# Patient Record
Sex: Female | Born: 1990 | Hispanic: Yes | Smoking: Never smoker
Health system: Southern US, Community
[De-identification: ages and names within clinical notes are randomized; demographics above are authoritative.]

---

## 2020-02-28 NOTE — L&D Delivery Note (Signed)
Delivery Note At 7:04 AM a viable female was delivered via Vaginal, Spontaneous (Presentation:   Occiput Anterior).  APGAR: 9, 9; weight  pending,.After 1 minute, the cord was clamped and cut. 40 units of pitocin diluted in 1000cc LR was infused rapidly IV.  The placenta separated spontaneously and delivered via CCT and maternal pushing effort.  It was inspected and appears to be intact with a 3 VC, succenturiate lobe noted, intact. She kept having a boggy uterus w/gushes of blood; bladder emptied, IM Methergine, cytotec pr, IM hemabate, IV TXA all give.  Dr. Donavan Foil assessed pt; after about 30 minutes, uterus became firm and bleeding normal.   Anesthesia: None Episiotomy: None Lacerations: None Suture Repair:  Est. Blood Loss (mL):  700  Mom to postpartum.  Baby to Couplet care / Skin to Skin.  Crystal Curry 09/17/2020, 8:05 AM

## 2020-05-27 LAB — OB RESULTS CONSOLE GC/CHLAMYDIA
Chlamydia: NEGATIVE
Gonorrhea: NEGATIVE

## 2020-05-27 LAB — OB RESULTS CONSOLE RPR: RPR: NONREACTIVE

## 2020-05-27 LAB — OB RESULTS CONSOLE HIV ANTIBODY (ROUTINE TESTING): HIV: NONREACTIVE

## 2020-05-27 LAB — OB RESULTS CONSOLE RUBELLA ANTIBODY, IGM: Rubella: IMMUNE

## 2020-05-27 LAB — OB RESULTS CONSOLE VARICELLA ZOSTER ANTIBODY, IGG: Varicella: IMMUNE

## 2020-05-27 LAB — OB RESULTS CONSOLE HEPATITIS B SURFACE ANTIGEN: Hepatitis B Surface Ag: NEGATIVE

## 2020-05-27 LAB — HEPATITIS C ANTIBODY: HCV Ab: NEGATIVE

## 2020-06-03 ENCOUNTER — Other Ambulatory Visit: Payer: Self-pay | Admitting: Obstetrics and Gynecology

## 2020-06-03 ENCOUNTER — Ambulatory Visit
Admission: RE | Admit: 2020-06-03 | Discharge: 2020-06-03 | Disposition: A | Discharge status: Home / Self Care | Payer: No Typology Code available for payment source | Source: Ambulatory Visit | Attending: Obstetrics and Gynecology | Admitting: Obstetrics and Gynecology

## 2020-06-03 DIAGNOSIS — R7611 Nonspecific reaction to tuberculin skin test without active tuberculosis: Secondary | ICD-10-CM

## 2020-06-25 ENCOUNTER — Other Ambulatory Visit: Payer: No Typology Code available for payment source

## 2020-06-25 ENCOUNTER — Ambulatory Visit: Payer: No Typology Code available for payment source

## 2020-06-28 ENCOUNTER — Other Ambulatory Visit: Payer: Self-pay

## 2020-06-28 ENCOUNTER — Other Ambulatory Visit: Payer: Self-pay | Admitting: *Deleted

## 2020-06-28 ENCOUNTER — Other Ambulatory Visit: Payer: Self-pay | Admitting: Family

## 2020-06-28 ENCOUNTER — Ambulatory Visit (HOSPITAL_BASED_OUTPATIENT_CLINIC_OR_DEPARTMENT_OTHER): Payer: Self-pay

## 2020-06-28 ENCOUNTER — Ambulatory Visit: Payer: No Typology Code available for payment source | Attending: Family | Admitting: *Deleted

## 2020-06-28 VITALS — BP 112/65 | HR 104 | Ht 65.0 in

## 2020-06-28 DIAGNOSIS — O99212 Obesity complicating pregnancy, second trimester: Secondary | ICD-10-CM

## 2020-06-28 DIAGNOSIS — Z3A25 25 weeks gestation of pregnancy: Secondary | ICD-10-CM | POA: Insufficient documentation

## 2020-06-28 DIAGNOSIS — Z3689 Encounter for other specified antenatal screening: Secondary | ICD-10-CM

## 2020-06-28 DIAGNOSIS — Z363 Encounter for antenatal screening for malformations: Secondary | ICD-10-CM

## 2020-06-28 DIAGNOSIS — Z6830 Body mass index (BMI) 30.0-30.9, adult: Secondary | ICD-10-CM

## 2020-08-09 ENCOUNTER — Other Ambulatory Visit: Payer: Self-pay

## 2020-08-09 ENCOUNTER — Encounter: Payer: Self-pay | Admitting: *Deleted

## 2020-08-09 ENCOUNTER — Ambulatory Visit: Payer: No Typology Code available for payment source | Admitting: *Deleted

## 2020-08-09 ENCOUNTER — Other Ambulatory Visit: Payer: Self-pay | Admitting: *Deleted

## 2020-08-09 ENCOUNTER — Ambulatory Visit: Payer: No Typology Code available for payment source | Attending: Obstetrics and Gynecology

## 2020-08-09 VITALS — BP 114/68 | HR 115

## 2020-08-09 DIAGNOSIS — Z362 Encounter for other antenatal screening follow-up: Secondary | ICD-10-CM

## 2020-08-09 DIAGNOSIS — Z3689 Encounter for other specified antenatal screening: Secondary | ICD-10-CM

## 2020-08-09 DIAGNOSIS — Z6830 Body mass index (BMI) 30.0-30.9, adult: Secondary | ICD-10-CM

## 2020-08-09 DIAGNOSIS — E669 Obesity, unspecified: Secondary | ICD-10-CM

## 2020-08-09 DIAGNOSIS — Z3A31 31 weeks gestation of pregnancy: Secondary | ICD-10-CM

## 2020-08-09 DIAGNOSIS — O43103 Malformation of placenta, unspecified, third trimester: Secondary | ICD-10-CM

## 2020-08-09 DIAGNOSIS — O99213 Obesity complicating pregnancy, third trimester: Secondary | ICD-10-CM

## 2020-09-06 ENCOUNTER — Other Ambulatory Visit: Payer: Self-pay

## 2020-09-06 ENCOUNTER — Encounter: Payer: Self-pay | Admitting: *Deleted

## 2020-09-06 ENCOUNTER — Other Ambulatory Visit: Payer: Self-pay | Admitting: *Deleted

## 2020-09-06 ENCOUNTER — Ambulatory Visit: Payer: No Typology Code available for payment source | Admitting: *Deleted

## 2020-09-06 ENCOUNTER — Ambulatory Visit: Payer: Medicaid Other | Attending: Obstetrics and Gynecology

## 2020-09-06 VITALS — BP 103/67 | HR 99

## 2020-09-06 DIAGNOSIS — O365990 Maternal care for other known or suspected poor fetal growth, unspecified trimester, not applicable or unspecified: Secondary | ICD-10-CM

## 2020-09-06 DIAGNOSIS — Z6830 Body mass index (BMI) 30.0-30.9, adult: Secondary | ICD-10-CM

## 2020-09-06 DIAGNOSIS — Z362 Encounter for other antenatal screening follow-up: Secondary | ICD-10-CM

## 2020-09-06 DIAGNOSIS — E669 Obesity, unspecified: Secondary | ICD-10-CM

## 2020-09-06 DIAGNOSIS — O99213 Obesity complicating pregnancy, third trimester: Secondary | ICD-10-CM | POA: Diagnosis not present

## 2020-09-06 DIAGNOSIS — Z3A35 35 weeks gestation of pregnancy: Secondary | ICD-10-CM

## 2020-09-06 DIAGNOSIS — O43103 Malformation of placenta, unspecified, third trimester: Secondary | ICD-10-CM

## 2020-09-06 DIAGNOSIS — Z3689 Encounter for other specified antenatal screening: Secondary | ICD-10-CM | POA: Diagnosis not present

## 2020-09-17 ENCOUNTER — Encounter (HOSPITAL_COMMUNITY): Payer: Self-pay | Admitting: Obstetrics and Gynecology

## 2020-09-17 ENCOUNTER — Other Ambulatory Visit: Payer: Self-pay

## 2020-09-17 ENCOUNTER — Inpatient Hospital Stay (HOSPITAL_COMMUNITY)
Admission: AD | Admit: 2020-09-17 | Discharge: 2020-09-19 | DRG: 807 | Disposition: A | Discharge status: Home / Self Care | Payer: Medicaid Other | Attending: Obstetrics and Gynecology | Admitting: Obstetrics and Gynecology

## 2020-09-17 DIAGNOSIS — O99820 Streptococcus B carrier state complicating pregnancy: Secondary | ICD-10-CM | POA: Diagnosis not present

## 2020-09-17 DIAGNOSIS — Z20822 Contact with and (suspected) exposure to covid-19: Secondary | ICD-10-CM | POA: Diagnosis present

## 2020-09-17 DIAGNOSIS — Z3A36 36 weeks gestation of pregnancy: Secondary | ICD-10-CM

## 2020-09-17 DIAGNOSIS — O99824 Streptococcus B carrier state complicating childbirth: Secondary | ICD-10-CM | POA: Diagnosis present

## 2020-09-17 LAB — CBC
HCT: 37.8 % (ref 36.0–46.0)
HCT: 38.5 % (ref 36.0–46.0)
Hemoglobin: 12.5 g/dL (ref 12.0–15.0)
Hemoglobin: 12.5 g/dL (ref 12.0–15.0)
MCH: 27.4 pg (ref 26.0–34.0)
MCH: 27.2 pg (ref 26.0–34.0)
MCHC: 33.1 g/dL (ref 30.0–36.0)
MCHC: 32.5 g/dL (ref 30.0–36.0)
MCV: 82.7 fL (ref 80.0–100.0)
MCV: 83.7 fL (ref 80.0–100.0)
Platelets: 311 10*3/uL (ref 150–400)
Platelets: 327 10*3/uL (ref 150–400)
RBC: 4.57 MIL/uL (ref 3.87–5.11)
RBC: 4.6 MIL/uL (ref 3.87–5.11)
RDW: 13.3 % (ref 11.5–15.5)
RDW: 13.5 % (ref 11.5–15.5)
WBC: 13.3 10*3/uL — ABNORMAL HIGH (ref 4.0–10.5)
WBC: 20.7 10*3/uL — ABNORMAL HIGH (ref 4.0–10.5)
nRBC: 0 % (ref 0.0–0.2)
nRBC: 0 % (ref 0.0–0.2)

## 2020-09-17 LAB — RESP PANEL BY RT-PCR (FLU A&B, COVID) ARPGX2
Influenza A by PCR: NEGATIVE
Influenza B by PCR: NEGATIVE
SARS Coronavirus 2 by RT PCR: NEGATIVE

## 2020-09-17 LAB — RPR: RPR Ser Ql: NONREACTIVE

## 2020-09-17 LAB — TYPE AND SCREEN
ABO/RH(D): O POS
Antibody Screen: NEGATIVE

## 2020-09-17 LAB — POCT FERN TEST

## 2020-09-17 MED ORDER — TRANEXAMIC ACID-NACL 1000-0.7 MG/100ML-% IV SOLN: 1000.0000 mg | INTRAVENOUS | Status: AC

## 2020-09-17 MED ORDER — OXYCODONE-ACETAMINOPHEN 5-325 MG PO TABS
1.0000 | ORAL_TABLET | ORAL | Status: DC | PRN
  Administered 2020-09-17: 1 via ORAL
  Filled 2020-09-17: qty 1

## 2020-09-17 MED ORDER — OXYCODONE-ACETAMINOPHEN 5-325 MG PO TABS
2.0000 | ORAL_TABLET | ORAL | Status: DC | PRN
Start: 2020-09-17 — End: 2020-09-17

## 2020-09-17 MED ORDER — IBUPROFEN 600 MG PO TABS
600.0000 mg | ORAL_TABLET | Freq: Four times a day (QID) | ORAL | Status: DC
  Administered 2020-09-17 – 2020-09-19 (×9): 600 mg via ORAL
  Filled 2020-09-17 (×9): qty 1

## 2020-09-17 MED ORDER — METHYLERGONOVINE MALEATE 0.2 MG/ML IJ SOLN: 0.2000 mg | Freq: Once | INTRAMUSCULAR | Status: AC

## 2020-09-17 MED ORDER — MEASLES, MUMPS & RUBELLA VAC IJ SOLR: 0.5000 mL | Freq: Once | INTRAMUSCULAR | Status: DC

## 2020-09-17 MED ORDER — OXYTOCIN-SODIUM CHLORIDE 30-0.9 UT/500ML-% IV SOLN
2.5000 [IU]/h | INTRAVENOUS | Status: DC
  Administered 2020-09-17: 2.5 [IU]/h via INTRAVENOUS

## 2020-09-17 MED ORDER — CARBOPROST TROMETHAMINE 250 MCG/ML IM SOLN
250.0000 ug | Freq: Once | INTRAMUSCULAR | Status: AC
  Administered 2020-09-17: 250 ug via INTRAMUSCULAR

## 2020-09-17 MED ORDER — FLEET ENEMA 7-19 GM/118ML RE ENEM: 1.0000 | ENEMA | RECTAL | Status: DC | PRN

## 2020-09-17 MED ORDER — ACETAMINOPHEN 325 MG PO TABS: 650.0000 mg | ORAL_TABLET | ORAL | Status: DC | PRN

## 2020-09-17 MED ORDER — CARBOPROST TROMETHAMINE 250 MCG/ML IM SOLN
INTRAMUSCULAR | Status: AC
  Filled 2020-09-17: qty 1

## 2020-09-17 MED ORDER — METHYLERGONOVINE MALEATE 0.2 MG/ML IJ SOLN
INTRAMUSCULAR | Status: AC
  Administered 2020-09-17: 0.2 mg via INTRAMUSCULAR
  Filled 2020-09-17: qty 1

## 2020-09-17 MED ORDER — SIMETHICONE 80 MG PO CHEW: 80.0000 mg | CHEWABLE_TABLET | ORAL | Status: DC | PRN

## 2020-09-17 MED ORDER — ONDANSETRON HCL 4 MG PO TABS: 4.0000 mg | ORAL_TABLET | ORAL | Status: DC | PRN

## 2020-09-17 MED ORDER — DIBUCAINE (PERIANAL) 1 % EX OINT: 1.0000 "application " | TOPICAL_OINTMENT | CUTANEOUS | Status: DC | PRN

## 2020-09-17 MED ORDER — FENTANYL CITRATE (PF) 100 MCG/2ML IJ SOLN: 50.0000 ug | INTRAMUSCULAR | Status: DC | PRN

## 2020-09-17 MED ORDER — ONDANSETRON HCL 4 MG/2ML IJ SOLN: 4.0000 mg | INTRAMUSCULAR | Status: DC | PRN

## 2020-09-17 MED ORDER — MAGNESIUM HYDROXIDE 400 MG/5ML PO SUSP: 30.0000 mL | ORAL | Status: DC | PRN

## 2020-09-17 MED ORDER — PRENATAL MULTIVITAMIN CH
1.0000 | ORAL_TABLET | Freq: Every day | ORAL | Status: DC
Start: 2020-09-17 — End: 2020-09-19
  Administered 2020-09-17 – 2020-09-19 (×3): 1 via ORAL
  Filled 2020-09-17 (×3): qty 1

## 2020-09-17 MED ORDER — HYDROXYZINE HCL 25 MG PO TABS
50.0000 mg | ORAL_TABLET | Freq: Four times a day (QID) | ORAL | Status: DC | PRN
Start: 2020-09-17 — End: 2020-09-17
  Filled 2020-09-17: qty 1

## 2020-09-17 MED ORDER — MISOPROSTOL 200 MCG PO TABS: 800.0000 ug | ORAL_TABLET | Freq: Once | ORAL | Status: DC

## 2020-09-17 MED ORDER — COCONUT OIL OIL: 1.0000 "application " | TOPICAL_OIL | Status: DC | PRN

## 2020-09-17 MED ORDER — ONDANSETRON HCL 4 MG/2ML IJ SOLN: 4.0000 mg | Freq: Four times a day (QID) | INTRAMUSCULAR | Status: DC | PRN

## 2020-09-17 MED ORDER — MISOPROSTOL 200 MCG PO TABS
ORAL_TABLET | ORAL | Status: AC
  Administered 2020-09-17: 800 ug via RECTAL
  Filled 2020-09-17: qty 4

## 2020-09-17 MED ORDER — LACTATED RINGERS IV SOLN: INTRAVENOUS | Status: DC

## 2020-09-17 MED ORDER — LIDOCAINE HCL (PF) 1 % IJ SOLN
30.0000 mL | INTRAMUSCULAR | Status: DC | PRN
  Filled 2020-09-17: qty 30

## 2020-09-17 MED ORDER — DIPHENHYDRAMINE HCL 25 MG PO CAPS: 25.0000 mg | ORAL_CAPSULE | Freq: Four times a day (QID) | ORAL | Status: DC | PRN

## 2020-09-17 MED ORDER — PENICILLIN G POT IN DEXTROSE 60000 UNIT/ML IV SOLN: 3.0000 10*6.[IU] | INTRAVENOUS | Status: DC

## 2020-09-17 MED ORDER — MISOPROSTOL 200 MCG PO TABS: 800.0000 ug | ORAL_TABLET | Freq: Once | ORAL | Status: AC

## 2020-09-17 MED ORDER — SOD CITRATE-CITRIC ACID 500-334 MG/5ML PO SOLN: 30.0000 mL | ORAL | Status: DC | PRN

## 2020-09-17 MED ORDER — LACTATED RINGERS IV SOLN
500.0000 mL | INTRAVENOUS | Status: DC | PRN
  Administered 2020-09-17: 1000 mL via INTRAVENOUS

## 2020-09-17 MED ORDER — WITCH HAZEL-GLYCERIN EX PADS: 1.0000 "application " | MEDICATED_PAD | CUTANEOUS | Status: DC | PRN

## 2020-09-17 MED ORDER — PENICILLIN G POTASSIUM 5000000 UNITS IJ SOLR
5.0000 10*6.[IU] | Freq: Once | INTRAMUSCULAR | Status: AC
  Administered 2020-09-17: 5 10*6.[IU] via INTRAVENOUS
  Filled 2020-09-17: qty 5

## 2020-09-17 MED ORDER — OXYTOCIN BOLUS FROM INFUSION
333.0000 mL | Freq: Once | INTRAVENOUS | Status: AC
Start: 2020-09-17 — End: 2020-09-17
  Administered 2020-09-17: 333 mL via INTRAVENOUS

## 2020-09-17 MED ORDER — BENZOCAINE-MENTHOL 20-0.5 % EX AERO
1.0000 "application " | INHALATION_SPRAY | CUTANEOUS | Status: DC | PRN
  Administered 2020-09-17: 1 via TOPICAL
  Filled 2020-09-17: qty 56

## 2020-09-17 MED ORDER — TRANEXAMIC ACID-NACL 1000-0.7 MG/100ML-% IV SOLN
INTRAVENOUS | Status: AC
  Administered 2020-09-17: 1000 mg via INTRAVENOUS
  Filled 2020-09-17: qty 100

## 2020-09-17 MED ORDER — DIPHENOXYLATE-ATROPINE 2.5-0.025 MG PO TABS
2.0000 | ORAL_TABLET | Freq: Once | ORAL | Status: AC
  Administered 2020-09-17: 2 via ORAL
  Filled 2020-09-17: qty 2

## 2020-09-17 MED ORDER — TETANUS-DIPHTH-ACELL PERTUSSIS 5-2.5-18.5 LF-MCG/0.5 IM SUSY: 0.5000 mL | PREFILLED_SYRINGE | Freq: Once | INTRAMUSCULAR | Status: DC

## 2020-09-17 NOTE — H&P (Signed)
Crystal Curry is a 30 y.o. female G3P2002 with IUP at [redacted]w[redacted]d by LMP, 19 week [place] presenting for ROM/labor.  She reports positive fetal movement. She denies leakage of fluid or vaginal bleeding.  Prenatal History/Complications: PNC at HD Pregnancy complications: none - Past Medical History: History reviewed. No pertinent past medical history.  Past Surgical History: History reviewed. No pertinent surgical history.  Obstetrical History: OB History     Gravida  3   Para  2   Term  2   Preterm      AB      Living  2      SAB      IAB      Ectopic      Multiple      Live Births               Social History: Social History   Socioeconomic History   Marital status: Unknown    Spouse name: Not on file   Number of children: Not on file   Years of education: Not on file   Highest education level: Not on file  Occupational History   Not on file  Tobacco Use   Smoking status: Never   Smokeless tobacco: Never  Vaping Use   Vaping Use: Never used  Substance and Sexual Activity   Alcohol use: Never   Drug use: Never   Sexual activity: Not on file  Other Topics Concern   Not on file  Social History Narrative   Not on file   Social Determinants of Health   Financial Resource Strain: Not on file  Food Insecurity: Not on file  Transportation Needs: Not on file  Physical Activity: Not on file  Stress: Not on file  Social Connections: Not on file    Family History: Family History  Problem Relation Age of Onset   Diabetes Mother     Allergies: No Known Allergies  Medications Prior to Admission  Medication Sig Dispense Refill Last Dose   Prenatal Vit-Fe Fumarate-FA (PRENATAL MULTIVITAMIN) TABS tablet Take 1 tablet by mouth daily at 12 noon.       Review of Systems   Constitutional: Negative for fever and chills Eyes: Negative for visual disturbances Respiratory: Negative for shortness of breath, dyspnea Cardiovascular: Negative  for chest pain or palpitations  Gastrointestinal: Negative for vomiting, diarrhea and constipation.  POSITIVE for abdominal pain (contractions) Genitourinary: Negative for dysuria and urgency Musculoskeletal: Negative for back pain, joint pain, myalgias  Neurological: Negative for dizziness and headaches  Blood pressure 124/76, pulse 90, temperature 98.6 F (37 C), temperature source Oral, resp. rate 18, height 5\' 4"  (1.626 m), weight 99.8 kg, last menstrual period 01/05/2020, SpO2 99 %. General appearance: alert, cooperative, and mild distress Lungs: normal respiratory effort Heart: regular rate and rhythm Abdomen: soft, non-tender; bowel sounds normal Extremities: Homans sign is negative, no sign of DVT DTR's 2+ Presentation: cephalic Fetal monitoring  Baseline: 150 bpm, Variability: Good {> 6 bpm), Accelerations: Reactive, and Decelerations: Absent Uterine activity  2 Dilation: 8 Effacement (%): 90 Station: 0 Exam by:: [email or web address] RNC   Prenatal labs: ABO, Rh: --/--/O POS (07/22 05-08-1967) Antibody: NEG (07/22 [birth year]-08-1968) Rubella:  imm RPR:   neg HBsAg:   neg HIV:   neg GBS:   pos 1 hr Glucola 86 Genetic screening  declined Anatomy 9604 normal,  Prenatal Transfer Tool  Maternal Diabetes: No Genetic Screening: Declined Maternal Ultrasounds/Referrals: Normal Fetal Ultrasounds or other Referrals:  None  Maternal Substance Abuse:  No Significant Maternal Medications:  None Significant Maternal Lab Results: Group B Strep positive  Results for orders placed or performed during the hospital encounter of 09/17/20 (from the past 24 hour(s))  POCT fern test   Collection Time: 09/17/20  5:51 AM  Result Value Ref Range   POCT Fern Test    CBC   Collection Time: 09/17/20  6:19 AM  Result Value Ref Range   WBC 13.3 (H) 4.0 - 10.5 K/uL   RBC 4.57 3.87 - 5.11 MIL/uL   Hemoglobin 12.5 12.0 - 15.0 g/dL   HCT 16.1 09.6 - 04.5 %   MCV 82.7 80.0 - 100.0 fL   MCH 27.4 26.0 - 34.0 pg   MCHC  33.1 30.0 - 36.0 g/dL   RDW 40.9 81.1 - 91.4 %   Platelets 311 150 - 400 K/uL   nRBC 0.0 0.0 - 0.2 %  Type and screen [facility]   Collection Time: 09/17/20  6:19 AM  Result Value Ref Range   ABO/RH(D) O POS    Antibody Screen NEG    Sample Expiration      09/20/2020,2359 Performed at [facility], [address], [place], [place] [address]   Resp Panel by RT-PCR (Flu A&B, Covid) Nasopharyngeal Swab   Collection Time: 09/17/20  6:21 AM   Specimen: Nasopharyngeal Swab; Nasopharyngeal(NP) swabs in vial transport medium  Result Value Ref Range   SARS Coronavirus 2 by RT PCR NEGATIVE NEGATIVE   Influenza A by PCR NEGATIVE NEGATIVE   Influenza B by PCR NEGATIVE NEGATIVE    Assessment: Crystal Curry. Crystal Curry is a 30 y.o. G3P2002 with an IUP at [redacted]w[redacted]d presenting for labor  Plan: #Labor: expectant management #Pain:  Per request #FWB Cat 1 #ID: GBS: PCN  #MOF:   breast #MOC: depo #Circ: n/a   Crystal Curry 09/17/2020, 8:11 AM

## 2020-09-17 NOTE — MAU Note (Signed)
Pt states water broke about 0330 and contractions started the same time. Was checked Thrus and cervix closed. States has 2 placentas so must be sure both are removed at delivery

## 2020-09-17 NOTE — Progress Notes (Signed)
To R lateral after sve. Transducer adj

## 2020-09-17 NOTE — Plan of Care (Signed)
  Problem: Education: Goal: Knowledge of condition will improve Note: Admission education, safety and unit protocols reviewed with patient and significant other using video interpreter "Ashby Dawes [phone]." Instructed patient to call for assistance to the bathroom for the first time; however, when the NT went to assist patient to bathroom, patient stated she had already been and voided well. Earl Gala, Linda Hedges Ashley

## 2020-09-17 NOTE — Progress Notes (Signed)
Drenda Freeze CNM aware of pt's admission and status. Aware of SROM at 0330 with clear fld and pos fern in MAU. Aware of sve, 2 placentas that are shared (per pt), FM strip with decreased variability and variables. Will admit to [facility]

## 2020-09-17 NOTE — Lactation Note (Signed)
This note was copied from a baby's chart. Lactation Consultation Note  Patient Name: Crystal Curry [id number] Date: 09/17/2020 Reason for consult: Initial assessment;Late-preterm 34-36.6wks;Infant < 6lbs Age:29 hours  P3 mother whose infant is now 11 hours old.  This is a LPTI at 36+4 weeks.  Mother's feeding preference is breast/formula.  In house Spanish interpreter used for initial teaching and Reyne Dumas [phone]) used for breast pump teaching.  Reviewed the LPTI policy with mother.  Discussed supplementation volumes and, per interpreter, mother was instructed by the MD to feed 15 mls with every feeding.  She has the gold slow flow nipples at bedside and stated that baby fed well with this nipple.    Encouraged to feed at least every three hours and sooner if baby shows feeding cues.  Offered to initiate the DEBP and mother interested.  Reviewed pump parts, assembly and cleaning.  Mother did a return demonstration of pump parts set up.  Cleaning discussed and wash station set up.  After 10 minutes of pumping mother was already able to see colostrum.  She will feed back any EBM she obtains prior to formula supplementation.  Mom made aware of O/P services, breastfeeding support groups, community resources, and our phone # for post-discharge questions.  Mother does not have a DEBP for home use.  She is a [facility] participant in [employer].  With mother's permission, referral faxed.  Asked mother to follow up on Monday to determine eligibiltiy since they are not available on the weekends.  Mother verbalized understanding.  Family member present.  RN updated.    Maternal Data Has patient been taught Hand Expression?: Yes Does the patient have breastfeeding experience prior to this delivery?: Yes  Feeding Mother's Current Feeding Choice: Breast Milk and Formula Nipple Type: Slow - flow  LATCH Score                    Lactation Tools Discussed/Used Tools:  Pump;Flanges Flange Size: 24 Breast pump type: Double-Electric Breast Pump;Manual Pump Education: Setup, frequency, and cleaning;Milk Storage Reason for Pumping: Breast stimulation and supplementation for LPTI Pumping frequency: Every three hours  Interventions    Discharge Pump: DEBP;Manual;Personal (Mother desires a [facility] DEBP)  Consult Status Consult Status: Follow-up Date: 09/18/20 Follow-up type: In-patient    Aamna Mallozzi R Lydiana Milley 09/17/2020, 3:06 PM

## 2020-09-17 NOTE — Progress Notes (Signed)
Drenda Freeze CNm aware of pt's sve and having more urge to push. CNM with pt in BS and this RN will call CNM soon for delivery

## 2020-09-17 NOTE — Progress Notes (Signed)
Unable to take pt to BS at this time.

## 2020-09-18 NOTE — Lactation Note (Signed)
This note was copied from a baby's chart. Lactation Consultation Note  Patient Name: Crystal Curry LOVFI'E Date: 09/18/2020 Reason for consult: Follow-up assessment;Late-preterm 34-36.6wks Age:29 hours  Follow up visit to 31 hours old LPT infant. Mother reports pumping twice and collecting ~4 mL with last session. Mother states infant is breastfeeding well ~15 minutes and supplementing with ~12 mL of formula.  per feeding currently. Reinforced LPTI guidelines and mother agrees.  Reviewed pace bottle-feeding technique, upright position and frequent burping. Infant has been been having good voids and stools.    Feeding plan:  1-Skin to skin 2-Aim for a deep, comfortable latch 3-Breastfeeding on demand or 8-12 times in 24h period. 4-Keep infant awake during breastfeeding session: massaging breast, infant's hand/shoulder/feet 5-Pump and supplement following guidelines, paced bottle feeding and fullness cues.  6-Preserve infant energy limiting feeding sessions to 30 min max.  7-Monitor voids and stools as signs good intake.  8-Encouraged maternal rest, hydration and food intake.  9-Contact LC as needed for feeds/support/concerns/questions   All questions answered at this time.    Maternal Data How long did the patient breastfeed?: ~12 months x2  Feeding Mother's Current Feeding Choice: Breast Milk and Formula Nipple Type: Slow - flow  LATCH Score Latch: Grasps breast easily, tongue down, lips flanged, rhythmical sucking.  Audible Swallowing: A few with stimulation  Type of Nipple: Everted at rest and after stimulation  Comfort (Breast/Nipple): Soft / non-tender  Hold (Positioning): No assistance needed to correctly position infant at breast.  LATCH Score: 9   Lactation Tools Discussed/Used Pumped volume: 4 mL  Interventions Interventions: Breast feeding basics reviewed;Education;DEBP;Expressed milk;Hand pump;Hand express  Discharge Discharge Education:  Engorgement and breast care  Consult Status Consult Status: Follow-up Date: 09/19/20 Follow-up type: In-patient    Crystal Curry A Crystal Curry 09/18/2020, 2:18 PM

## 2020-09-18 NOTE — Progress Notes (Addendum)
POSTPARTUM PROGRESS NOTE  Post Partum Day 1  Subjective:  Crystal Curry. Crystal Curry is a 30 y.o. [id number] s/p SVD at [redacted]w[redacted]d.  She reports she is doing well. No acute events overnight. She denies any problems with ambulating, voiding or po intake. Denies nausea or vomiting.  Pain is well controlled.  Lochia is mild.  Objective: Blood pressure 110/81, pulse 85, temperature 98.8 F (37.1 C), temperature source Oral, resp. rate 18, height 5\' 4"  (1.626 m), weight 99.8 kg, last menstrual period 01/05/2020, SpO2 98 %, unknown if currently breastfeeding.  Physical Exam:  General: alert, cooperative and no distress Chest: no respiratory distress Heart:regular rate, distal pulses intact Abdomen: soft, nontender,  Uterine Fundus: firm, appropriately tender DVT Evaluation: No calf swelling or tenderness Extremities: no edema Skin: warm, dry  Recent Labs    09/17/20 0619 09/17/20 1004  HGB 12.5 12.5  HCT 37.8 38.5    Assessment/Plan: 09/19/20 C. Birdia Jaycox is a 30 y.o. [phone] s/p SVD complicated by EBL [id number] after Uterine atony at [redacted]w[redacted]d   PPD#1 - Doing well  Routine postpartum care Contraception: depo Feeding: breast Dispo: Plan for discharge 1.   LOS: 1 day   [redacted]w[redacted]d MD Resident Physician 09/18/2020, 8:27 AM  Attestation of Supervision of Student:  I confirm that I have verified the information documented in the  resident  student's note and that I have also personally reperformed the history, physical exam and all medical decision making activities.  I have verified that all services and findings are accurately documented in this student's note; and I agree with management and plan as outlined in the documentation. I have also made any necessary editorial changes.  09/20/2020, CNM [facility], [facility] 09/18/2020 11:07 AM

## 2020-09-19 MED ORDER — IBUPROFEN 600 MG PO TABS: 600.0000 mg | ORAL_TABLET | Freq: Four times a day (QID) | ORAL | 0 refills | Status: AC

## 2020-09-19 MED ORDER — SENNA 8.6 MG PO TABS: 1.0000 | ORAL_TABLET | Freq: Every day | ORAL | 0 refills | Status: AC

## 2020-09-19 MED ORDER — ACETAMINOPHEN 325 MG PO TABS: 650.0000 mg | ORAL_TABLET | ORAL | 0 refills | Status: AC | PRN

## 2020-09-19 NOTE — Plan of Care (Signed)
  Problem: Education: Goal: Knowledge of General Education information will improve Description: Including pain rating scale, medication(s)/side effects and non-pharmacologic comfort measures Outcome: Completed/Met   Problem: Clinical Measurements: Goal: Ability to maintain clinical measurements within normal limits will improve Outcome: Completed/Met Goal: Will remain free from infection Outcome: Completed/Met Goal: Diagnostic test results will improve Outcome: Completed/Met Goal: Respiratory complications will improve Outcome: Completed/Met Goal: Cardiovascular complication will be avoided Outcome: Completed/Met   Problem: Nutrition: Goal: Adequate nutrition will be maintained Outcome: Completed/Met   Problem: Coping: Goal: Level of anxiety will decrease Outcome: Completed/Met   Problem: Elimination: Goal: Will not experience complications related to bowel motility Outcome: Completed/Met Goal: Will not experience complications related to urinary retention Outcome: Completed/Met   Problem: Pain Managment: Goal: General experience of comfort will improve Outcome: Completed/Met   Problem: Skin Integrity: Goal: Risk for impaired skin integrity will decrease Outcome: Completed/Met   Problem: Education: Goal: Knowledge of condition will improve Outcome: Completed/Met   Problem: Activity: Goal: Will verbalize the importance of balancing activity with adequate rest periods Outcome: Completed/Met   Problem: Life Cycle: Goal: Chance of risk for complications during the postpartum period will decrease Outcome: Completed/Met   Problem: Role Relationship: Goal: Ability to demonstrate positive interaction with newborn will improve Outcome: Completed/Met

## 2020-09-19 NOTE — Discharge Summary (Addendum)
Postpartum Discharge Summary    Patient Name: Crystal Curry DOB: 06/30/1990 MRN: [id number]  Date of admission: 09/17/2020 Delivery date:09/17/2020  Delivering provider: CRESENZO-DISHMON, FRANCES  Date of discharge: 09/19/2020  Admitting diagnosis: Indication for care in labor or delivery [O75.9] Intrauterine pregnancy: [redacted]w[redacted]d     Secondary diagnosis:  Active Problems:   Indication for care in labor or delivery   Vaginal delivery  Additional problems: Uterine atony requiring medication. GBS positive    Discharge diagnosis: Preterm Pregnancy Delivered and uterine atony not meeting PPH criteria                                               Post partum procedures: none Augmentation: N/A Complications: None  Hospital course: Onset of Labor With Vaginal Delivery      30 y.o. yo G3P2103 at [redacted]w[redacted]d was admitted in Active Labor on 09/17/2020. Patient had a labor course complicated by uterine atony requiring pitocin, cytotec, hemabate, TXA, methergine with EBL 700cc. Otherwise labor course was uncomplicated as follows:  Membrane Rupture Time/Date: 3:30 AM ,09/17/2020   Delivery Method:Vaginal, Spontaneous  Episiotomy: None  Lacerations:  None  Patient had an uncomplicated postpartum course.  She is ambulating, tolerating a regular diet, passing flatus, with bowel movement since delivery and urinating well. Patient is discharged home in stable condition on 09/19/20.  Newborn Data: Birth date:09/17/2020  Birth time:7:04 AM  Gender:Female  Living status:Living  Apgars:9 ,9  Weight:2670 g   Magnesium Sulfate received: No BMZ received: No Rhophylac:No MMR:No T-DaP:Given prenatally Flu: N/A Transfusion:No  Physical exam  Vitals:   09/18/20 0529 09/18/20 1523 09/18/20 1937 09/19/20 0643  BP: 110/81 111/71 105/61 112/74  Pulse: 85 92 76 72  Resp: 18 18 18 18  Temp: 98.8 F (37.1 C) 97.8 F (36.6 C) 98.4 F (36.9 C) 98.1 F (36.7 C)  TempSrc: Oral Oral Oral Oral  SpO2:  100%     Weight:      Height:       General: alert, cooperative, and no distress Lochia: appropriate Uterine Fundus: firm Incision: N/A DVT Evaluation: No evidence of DVT seen on physical exam. Labs: Lab Results  Component Value Date   WBC 20.7 (H) 09/17/2020   HGB 12.5 09/17/2020   HCT 38.5 09/17/2020   MCV 83.7 09/17/2020   PLT 327 09/17/2020   No flowsheet data found. Edinburgh Score: Edinburgh Postnatal Depression Scale Screening Tool 09/18/2020  I have been able to laugh and see the funny side of things. 0  I have looked forward with enjoyment to things. 1  I have blamed myself unnecessarily when things went wrong. 0  I have been anxious or worried for no good reason. 0  I have felt scared or panicky for no good reason. 0  Things have been getting on top of me. 1  I have been so unhappy that I have had difficulty sleeping. 0  I have felt sad or miserable. 0  I have been so unhappy that I have been crying. 0  The thought of harming myself has occurred to me. 0  Edinburgh Postnatal Depression Scale Total 2     After visit meds:  Allergies as of 09/19/2020   No Known Allergies      Medication List     TAKE these medications    acetaminophen 325 MG tablet Commonly known as: Tylenol   Take 2 tablets (650 mg total) by mouth every 4 (four) hours as needed (for pain scale < 4).   ibuprofen 600 MG tablet Commonly known as: ADVIL Take 1 tablet (600 mg total) by mouth every 6 (six) hours.   prenatal multivitamin Tabs tablet Take 1 tablet by mouth daily at 12 noon.   senna 8.6 MG Tabs tablet Commonly known as: SENOKOT Take 1 tablet (8.6 mg total) by mouth daily.         Discharge home in stable condition Infant Feeding: Breast Infant Disposition:home with mother Discharge instruction: per After Visit Summary and Postpartum booklet. Activity: Advance as tolerated. Pelvic rest for 6 weeks.  Diet: routine diet Future Appointments:No future appointments. Follow up  Visit:  Follow-up Information     Department, [facility] Follow up in 4 week(s).   Why: Postpartum visit 4 weeks Contact information: [address] [place] [address] [phone]                  Please schedule this patient for a In person postpartum visit in 4 weeks with the following provider:  Health department . Additional Postpartum F/U: NA   Low risk pregnancy complicated by:  uterine atony but otherwise normal pregnancy and labor course Delivery mode:  Vaginal, Spontaneous  Anticipated Birth Control:  Depo   09/19/2020 Nicole E Nugent, NP    

## 2020-09-19 NOTE — Lactation Note (Signed)
This note was copied from a baby's chart. Lactation Consultation Note  Patient Name: Crystal Curry Date: 09/19/2020 Reason for consult: Follow-up assessment;Late-preterm 34-36.6wks;Infant < 6lbs Age:29 hours  P2, Baby sleeping after recent feeding. Mother states baby is doing well at the breast. Parents know to supplement after breastfeeding.   Discussed active sucking and swallowing. Encouraged mother to post pump after brreastfeeding. Feed on demand with cues.  Goal 8-12+ times per day after first 24 hrs.  Place baby STS if not cueing.  Reviewed engorgement care and monitoring voids/stools.   Feeding Mother's Current Feeding Choice: Breast Milk and Formula Nipple Type: Slow - flow   Lactation Tools Discussed/Used Tools: Pump Flange Size: 24 Breast pump type: Double-Electric Breast Pump Reason for Pumping: stimulation and supplementation Pumping frequency: q 3hours  Interventions Interventions: Breast feeding basics reviewed;DEBP;Education;Hand pump  Discharge Discharge Education: Engorgement and breast care;Warning signs for feeding baby Pump: Manual WIC Program: No  Consult Status  Complete    Hardie Pulley 09/19/2020, 10:59 AM

## 2020-09-19 NOTE — Progress Notes (Signed)
Intrepeter #[id number] ipad spanish interpreter used for discharge. Patient verbalized understanding

## 2020-09-21 LAB — SURGICAL PATHOLOGY

## 2020-09-29 ENCOUNTER — Telehealth (HOSPITAL_COMMUNITY): Payer: Self-pay | Admitting: *Deleted

## 2020-09-29 NOTE — Telephone Encounter (Signed)
Patient voiced no questions or concerns regarding her own health. EPDS = 1. Patient voiced no questions or concerns regarding baby at this time. Patient reported infant sleeps on her back in a bassinet. RN reviewed ABCs of safe sleep - patient verbalized understanding. Deforest Hoyles, RN, 09/29/20, [phone].

## 2020-09-30 ENCOUNTER — Ambulatory Visit: Payer: No Typology Code available for payment source

## 2022-10-05 IMAGING — CR DG CHEST 1V
1 series · 1 of 1 positions shown · non-contrast
Comparison: None.

CLINICAL DATA: 29-year-old female with PPD positive test

EXAM:
CHEST  1 VIEW

[w chest pa]
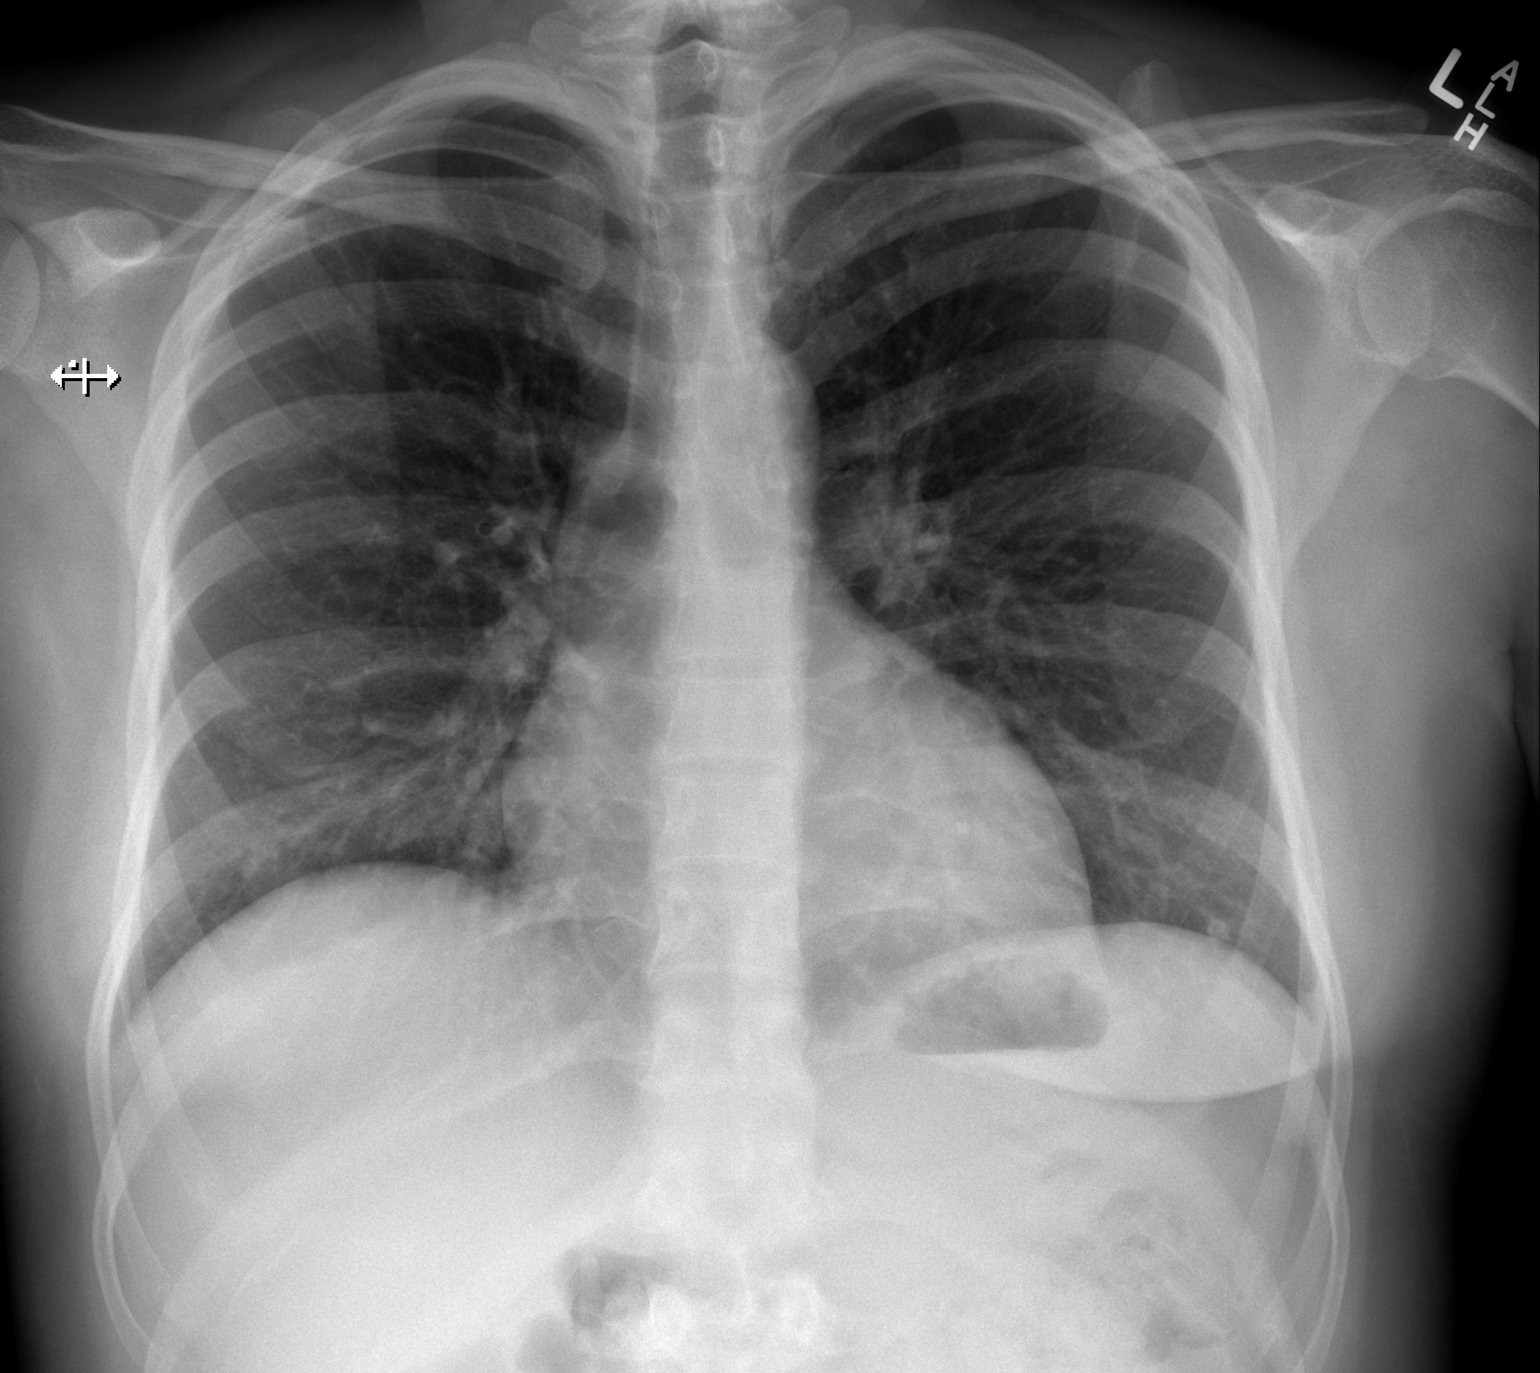

[1 of 1 positions shown; findings below may reference images not displayed]

FINDINGS: Cardiomediastinal silhouette within normal limits. No evidence of
central vascular congestion. No interlobular septal thickening. No
pneumothorax or pleural effusion.

No calcified mediastinal lymph nodes identified. Partially calcified
granuloma at the left lung base.

No displaced fracture
IMPRESSION: Negative for acute cardiopulmonary disease.

No evidence of calcified lymphadenopathy.

Partially calcified granuloma at the left lung base.

## 2023-01-08 IMAGING — US US MFM OB FOLLOW-UP
1 series · 13 of 28 positions shown · non-contrast
Comparison: none

[Series 1: us mfm ob follow-up · 66 acquisitions, 13 frames shown]
[im 3/66]
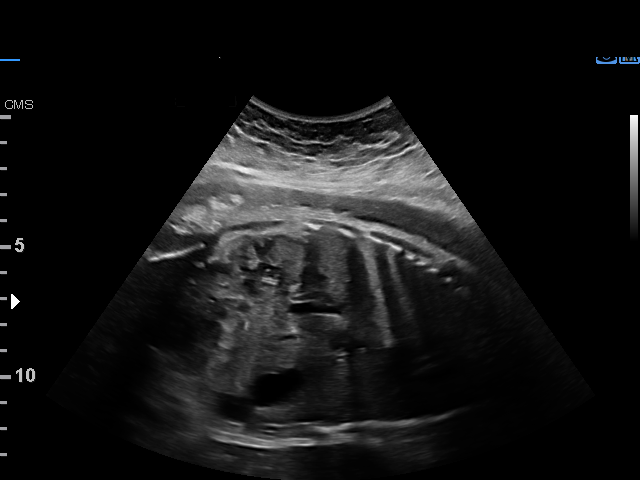
[im 8/66]
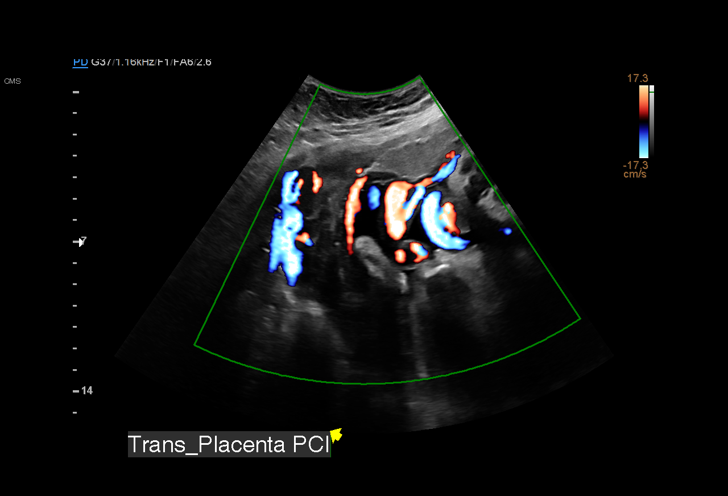
[im 13/66]
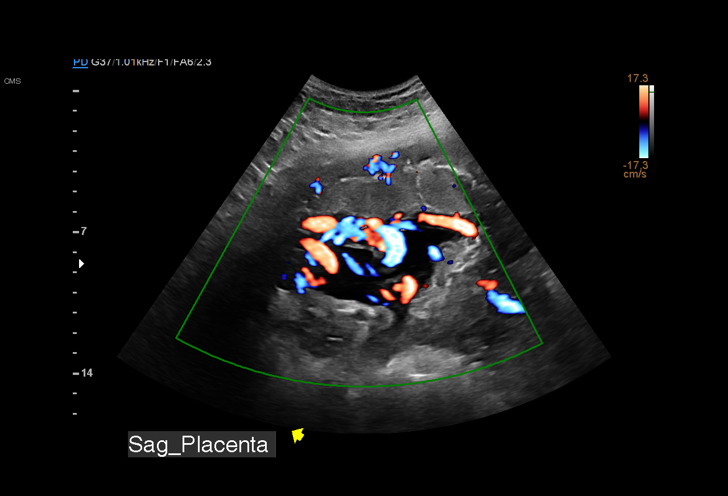
[im 17/66]
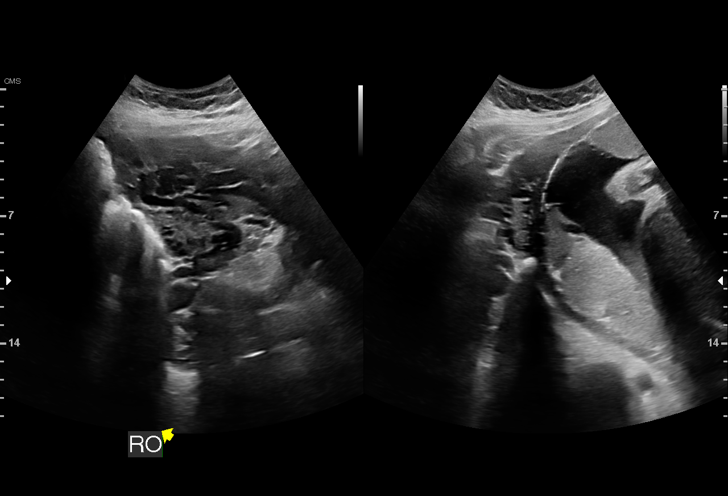
[im 22/66]
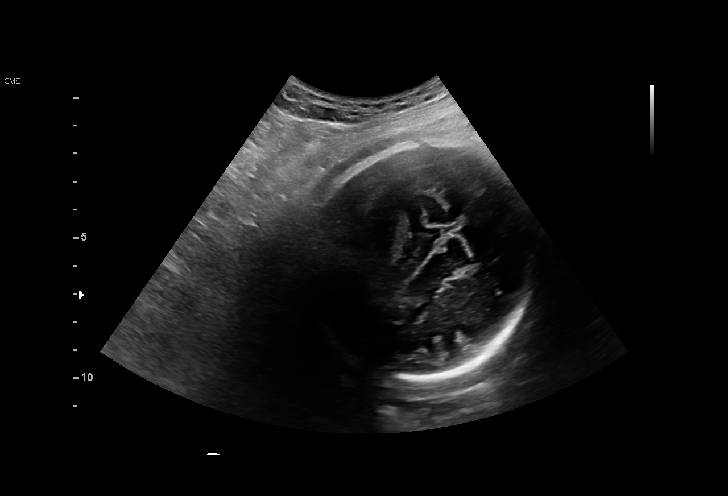
[im 27/66]
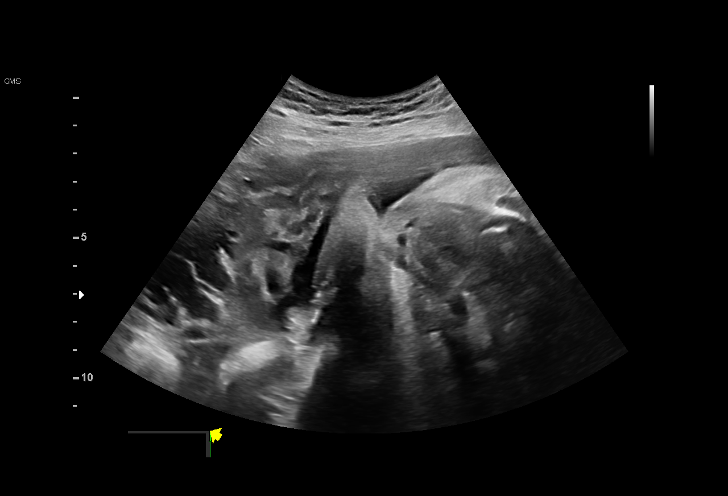
[im 34/66]
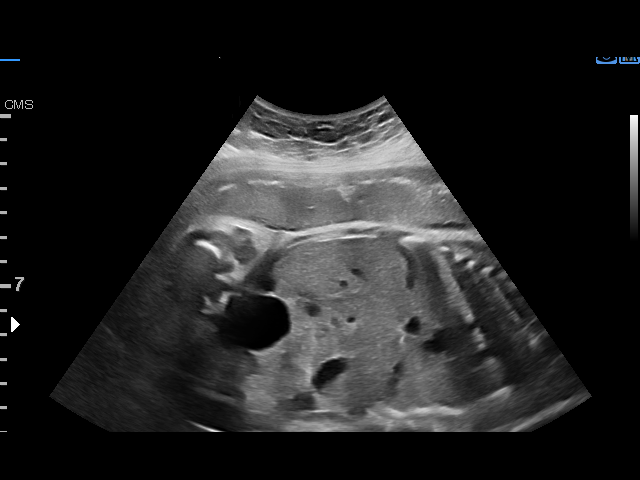
[im 39/66]
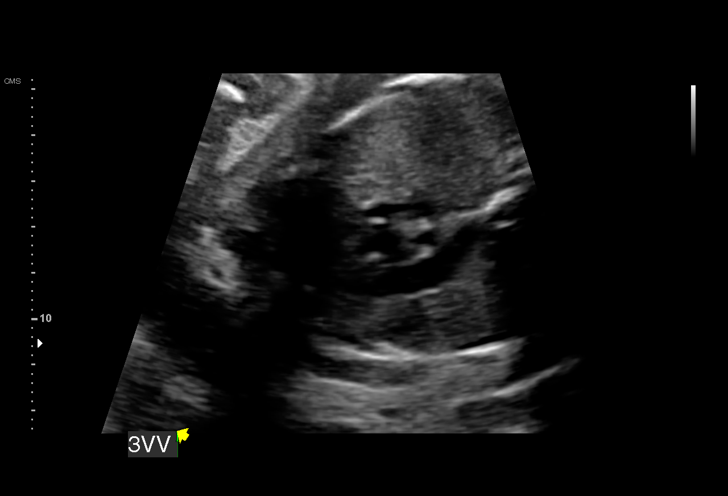
[im 44/66]
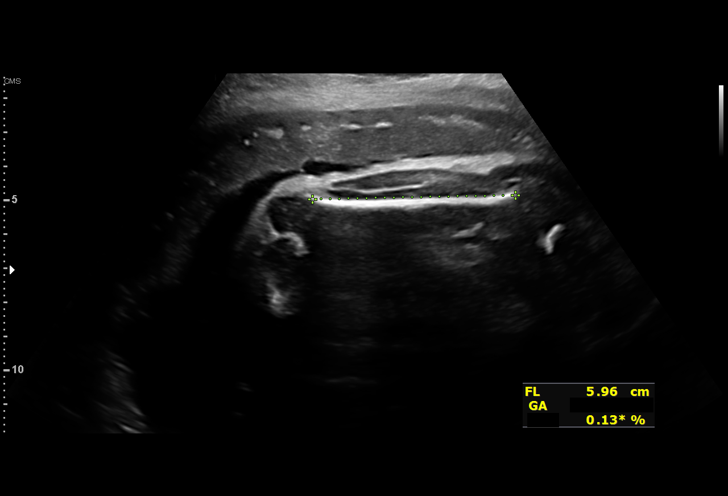
[im 49/66]
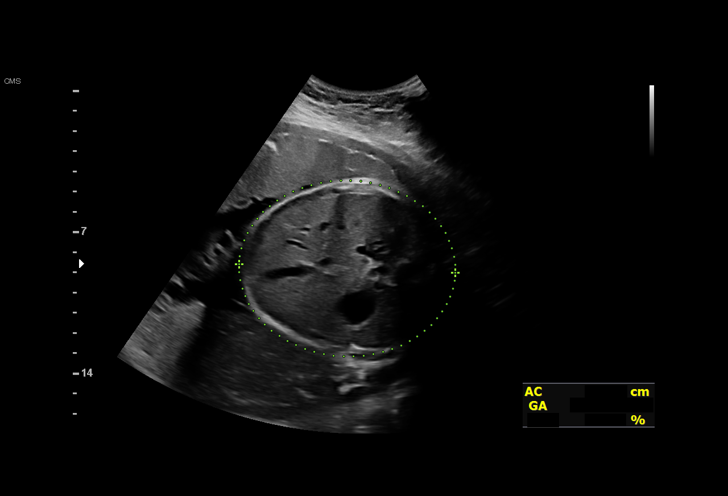
[im 53/66]
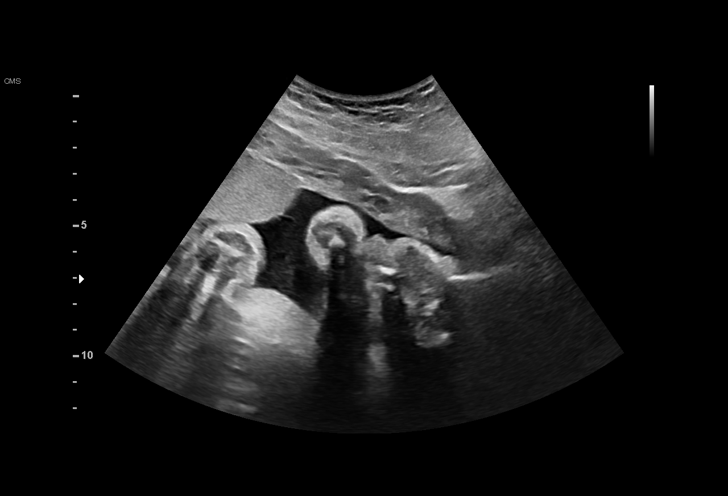
[im 58/66]
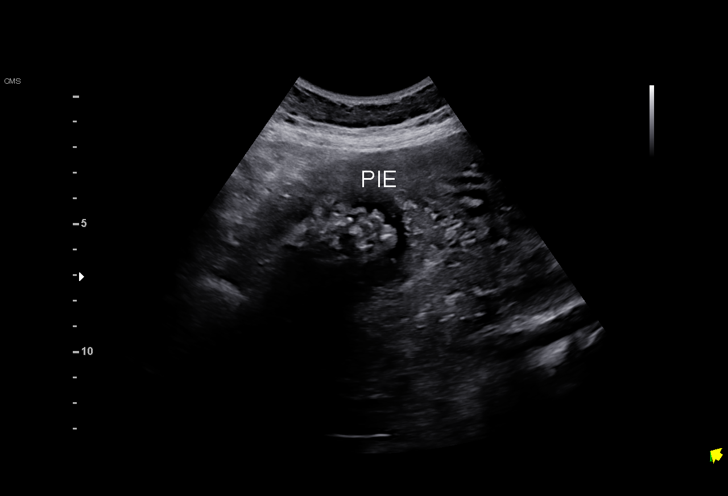
[im 63/66]
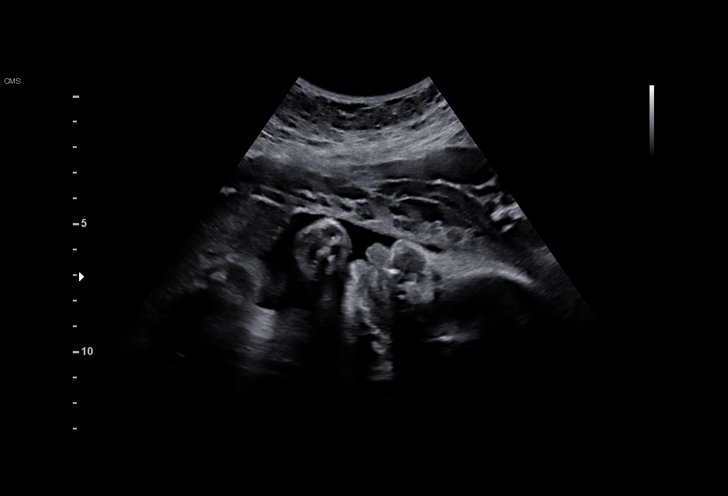

[13 of 28 positions shown; findings below may reference images not displayed]

HEINZ

Indications

 35 weeks gestation of pregnancy
 Encounter for other antenatal screening
 follow-up
 Obesity complicating pregnancy, third
 trimester (BMI 30)
 Placental abnormality complicating
 pregnancy, third trimester (succenturiate
 lobe)
Vital Signs

                                                Height:        5'5"
Fetal Evaluation

 Num Of Fetuses:         1
 Fetal Heart Rate(bpm):  145
 Cardiac Activity:       Observed
 Presentation:           Cephalic
 Placenta:               Succenturiate lobe
 P. Cord Insertion:      Marginal insertion

 Amniotic Fluid
 AFI FV:      Within normal limits

 AFI Sum(cm)     %Tile       Largest Pocket(cm)
 16.3            59

 RUQ(cm)       RLQ(cm)       LUQ(cm)        LLQ(cm)

Biometry

 BPD:      85.4  mm     G. Age:  34w 3d         35  %    CI:        79.85   %    70 - 86
                                                         FL/HC:      19.8   %    20.1 -
 HC:       302   mm     G. Age:  33w 4d        2.3  %    HC/AC:      0.99        0.93 -
 AC:      304.4  mm     G. Age:  34w 3d         39  %    FL/BPD:     69.9   %    71 - 87
 FL:       59.7  mm     G. Age:  31w 1d        < 1  %    FL/AC:      19.6   %    20 - 24
 HUM:      52.3  mm     G. Age:  30w 3d        < 5  %

 LV:        1.9  mm

 Est. FW:    7208  gm    4 lb 13 oz      11  %
OB History

 Gravidity:    1
Gestational Age

 LMP:           35w 0d        Date:  01/05/20                 EDD:   10/11/20
 U/S Today:     33w 3d                                        EDD:   10/22/20
 Best:          35w 0d     Det. By:  LMP  (01/05/20)          EDD:   10/11/20
Anatomy

 Cranium:               Appears normal         Aortic Arch:            Previously seen
 Cavum:                 Previously seen        Ductal Arch:            Previously seen
 Ventricles:            Appears normal         Diaphragm:              Appears normal
 Choroid Plexus:        Appears normal         Stomach:                Appears normal, left
                                                                       sided
 Cerebellum:            Appears normal         Abdomen:                Previously seen
 Posterior Fossa:       Appears normal         Abdominal Wall:         Previously seen
 Nuchal Fold:           Not applicable (>20    Cord Vessels:           Previously seen
                        wks GA)
 Face:                  Appears normal         Kidneys:                Appear normal
                        (orbits and profile)
 Lips:                  Appears normal         Bladder:                Appears normal
 Thoracic:              Previously seen        Spine:                  Previously seen
 Heart:                 Previously seen        Upper Extremities:      Previously seen
 RVOT:                  Appears normal         Lower Extremities:      Previously seen
 LVOT:                  Appears normal

 Other:  3VV/T visualized. Nasal bone and lenses visualized. Female gender
         previously seen. Heels previously seen.
Doppler - Fetal Vessels

 Umbilical Artery
  S/D     %tile                                              ADFV    RDFV
   3.1       83                                                 No      No

Cervix Uterus Adnexa

 Cervix
 Not visualized (advanced GA >36wks)
 Uterus
 No abnormality visualized.

 Right Ovary
 Within normal limits.

 Left Ovary
 Within normal limits.

 Cul De Sac
 No free fluid seen.

 Adnexa
 No abnormality visualized.
Impression

 Patient returned for fetal growth assessment.  She does not
 have gestational diabetes.  Blood pressure today at her office
 is 103/67 mmHg.

 Fetal growth is appropriate for gestational age.  The
 estimated fetal weight is at the 11th percentile.  Head
 circumference measurement is between -2 1-1 SD (normal).
 Amniotic fluid is normal and good fetal activity seen.
 Succenturiate lobe with possible marginal cord insertion is
 seen again.

 I reassured the patient of normal fetal growth.
Recommendations

 -An appointment was made for her to return in 3 weeks for
 fetal growth assessment.
                 Sanjar, Bazarbai
